# Patient Record
Sex: Female | Born: 1975 | Hispanic: No | Smoking: Never smoker
Health system: Southern US, Community
[De-identification: ages and names within clinical notes are randomized; demographics above are authoritative.]

## PROBLEM LIST (undated history)

## (undated) HISTORY — PX: PLACEMENT OF BREAST IMPLANTS: SHX6334

## (undated) HISTORY — PX: CHOLECYSTECTOMY: SHX55

---

## 1998-12-30 ENCOUNTER — Other Ambulatory Visit: Admission: RE | Admit: 1998-12-30 | Discharge: 1998-12-30 | Payer: Self-pay | Admitting: Obstetrics and Gynecology

## 2001-03-16 ENCOUNTER — Other Ambulatory Visit: Admission: RE | Admit: 2001-03-16 | Discharge: 2001-03-16 | Payer: Self-pay | Admitting: Obstetrics and Gynecology

## 2002-03-21 ENCOUNTER — Other Ambulatory Visit: Admission: RE | Admit: 2002-03-21 | Discharge: 2002-03-21 | Payer: Self-pay | Admitting: Obstetrics and Gynecology

## 2003-05-18 ENCOUNTER — Other Ambulatory Visit: Admission: RE | Admit: 2003-05-18 | Discharge: 2003-05-18 | Payer: Self-pay | Admitting: Obstetrics and Gynecology

## 2004-06-20 ENCOUNTER — Other Ambulatory Visit: Admission: RE | Admit: 2004-06-20 | Discharge: 2004-06-20 | Payer: Self-pay | Admitting: Obstetrics and Gynecology

## 2005-01-30 ENCOUNTER — Other Ambulatory Visit: Admission: RE | Admit: 2005-01-30 | Discharge: 2005-01-30 | Payer: Self-pay | Admitting: Obstetrics and Gynecology

## 2012-05-31 ENCOUNTER — Other Ambulatory Visit: Payer: Self-pay | Admitting: Obstetrics and Gynecology

## 2012-05-31 DIAGNOSIS — R928 Other abnormal and inconclusive findings on diagnostic imaging of breast: Secondary | ICD-10-CM

## 2012-06-07 ENCOUNTER — Other Ambulatory Visit: Payer: Self-pay | Admitting: Radiology

## 2012-06-13 ENCOUNTER — Other Ambulatory Visit: Payer: Self-pay

## 2016-03-04 DIAGNOSIS — J40 Bronchitis, not specified as acute or chronic: Secondary | ICD-10-CM | POA: Diagnosis not present

## 2016-05-20 DIAGNOSIS — Z6841 Body Mass Index (BMI) 40.0 and over, adult: Secondary | ICD-10-CM | POA: Diagnosis not present

## 2016-05-20 DIAGNOSIS — Z01419 Encounter for gynecological examination (general) (routine) without abnormal findings: Secondary | ICD-10-CM | POA: Diagnosis not present

## 2016-05-20 DIAGNOSIS — Z1231 Encounter for screening mammogram for malignant neoplasm of breast: Secondary | ICD-10-CM | POA: Diagnosis not present

## 2016-05-26 DIAGNOSIS — R635 Abnormal weight gain: Secondary | ICD-10-CM | POA: Diagnosis not present

## 2016-05-26 DIAGNOSIS — Z30433 Encounter for removal and reinsertion of intrauterine contraceptive device: Secondary | ICD-10-CM | POA: Diagnosis not present

## 2016-05-26 DIAGNOSIS — Z13220 Encounter for screening for lipoid disorders: Secondary | ICD-10-CM | POA: Diagnosis not present

## 2016-05-26 DIAGNOSIS — Z13228 Encounter for screening for other metabolic disorders: Secondary | ICD-10-CM | POA: Diagnosis not present

## 2016-05-26 DIAGNOSIS — R5383 Other fatigue: Secondary | ICD-10-CM | POA: Diagnosis not present

## 2016-09-24 DIAGNOSIS — R5383 Other fatigue: Secondary | ICD-10-CM | POA: Diagnosis not present

## 2016-11-23 DIAGNOSIS — L579 Skin changes due to chronic exposure to nonionizing radiation, unspecified: Secondary | ICD-10-CM | POA: Diagnosis not present

## 2016-11-23 DIAGNOSIS — D492 Neoplasm of unspecified behavior of bone, soft tissue, and skin: Secondary | ICD-10-CM | POA: Diagnosis not present

## 2016-11-23 DIAGNOSIS — L814 Other melanin hyperpigmentation: Secondary | ICD-10-CM | POA: Diagnosis not present

## 2016-11-23 DIAGNOSIS — D2339 Other benign neoplasm of skin of other parts of face: Secondary | ICD-10-CM | POA: Diagnosis not present

## 2016-11-23 DIAGNOSIS — L821 Other seborrheic keratosis: Secondary | ICD-10-CM | POA: Diagnosis not present

## 2017-02-09 DIAGNOSIS — F419 Anxiety disorder, unspecified: Secondary | ICD-10-CM | POA: Diagnosis not present

## 2017-02-16 DIAGNOSIS — J0100 Acute maxillary sinusitis, unspecified: Secondary | ICD-10-CM | POA: Diagnosis not present

## 2017-04-20 DIAGNOSIS — F419 Anxiety disorder, unspecified: Secondary | ICD-10-CM | POA: Diagnosis not present

## 2017-09-28 ENCOUNTER — Encounter: Payer: Self-pay | Admitting: Obstetrics & Gynecology

## 2017-12-20 DIAGNOSIS — Z01419 Encounter for gynecological examination (general) (routine) without abnormal findings: Secondary | ICD-10-CM | POA: Diagnosis not present

## 2017-12-20 DIAGNOSIS — Z1231 Encounter for screening mammogram for malignant neoplasm of breast: Secondary | ICD-10-CM | POA: Diagnosis not present

## 2017-12-20 DIAGNOSIS — Z6841 Body Mass Index (BMI) 40.0 and over, adult: Secondary | ICD-10-CM | POA: Diagnosis not present

## 2018-06-13 DIAGNOSIS — F419 Anxiety disorder, unspecified: Secondary | ICD-10-CM | POA: Diagnosis not present

## 2018-10-11 DIAGNOSIS — F419 Anxiety disorder, unspecified: Secondary | ICD-10-CM | POA: Diagnosis not present

## 2018-10-11 DIAGNOSIS — G4709 Other insomnia: Secondary | ICD-10-CM | POA: Diagnosis not present

## 2019-02-01 DIAGNOSIS — Z20828 Contact with and (suspected) exposure to other viral communicable diseases: Secondary | ICD-10-CM | POA: Diagnosis not present

## 2019-03-15 DIAGNOSIS — Z20828 Contact with and (suspected) exposure to other viral communicable diseases: Secondary | ICD-10-CM | POA: Diagnosis not present

## 2019-05-15 DIAGNOSIS — K5909 Other constipation: Secondary | ICD-10-CM | POA: Diagnosis not present

## 2019-06-21 DIAGNOSIS — Z6841 Body Mass Index (BMI) 40.0 and over, adult: Secondary | ICD-10-CM | POA: Diagnosis not present

## 2019-06-21 DIAGNOSIS — Z13228 Encounter for screening for other metabolic disorders: Secondary | ICD-10-CM | POA: Diagnosis not present

## 2019-06-21 DIAGNOSIS — Z1329 Encounter for screening for other suspected endocrine disorder: Secondary | ICD-10-CM | POA: Diagnosis not present

## 2019-06-21 DIAGNOSIS — Z803 Family history of malignant neoplasm of breast: Secondary | ICD-10-CM | POA: Diagnosis not present

## 2019-06-21 DIAGNOSIS — Z13220 Encounter for screening for lipoid disorders: Secondary | ICD-10-CM | POA: Diagnosis not present

## 2019-06-21 DIAGNOSIS — Z801 Family history of malignant neoplasm of trachea, bronchus and lung: Secondary | ICD-10-CM | POA: Diagnosis not present

## 2019-06-21 DIAGNOSIS — Z01419 Encounter for gynecological examination (general) (routine) without abnormal findings: Secondary | ICD-10-CM | POA: Diagnosis not present

## 2019-08-02 DIAGNOSIS — Z809 Family history of malignant neoplasm, unspecified: Secondary | ICD-10-CM | POA: Diagnosis not present

## 2019-08-02 DIAGNOSIS — Z9189 Other specified personal risk factors, not elsewhere classified: Secondary | ICD-10-CM | POA: Diagnosis not present

## 2019-08-04 DIAGNOSIS — Z1231 Encounter for screening mammogram for malignant neoplasm of breast: Secondary | ICD-10-CM | POA: Diagnosis not present

## 2020-03-06 ENCOUNTER — Other Ambulatory Visit: Payer: Self-pay | Admitting: Obstetrics and Gynecology

## 2020-03-06 DIAGNOSIS — Z9189 Other specified personal risk factors, not elsewhere classified: Secondary | ICD-10-CM

## 2020-04-10 ENCOUNTER — Ambulatory Visit
Admission: RE | Admit: 2020-04-10 | Discharge: 2020-04-10 | Disposition: A | Discharge status: Home / Self Care | Payer: BC Managed Care – PPO | Source: Ambulatory Visit | Attending: Obstetrics and Gynecology | Admitting: Obstetrics and Gynecology

## 2020-04-10 ENCOUNTER — Other Ambulatory Visit: Payer: Self-pay

## 2020-04-10 DIAGNOSIS — Z9189 Other specified personal risk factors, not elsewhere classified: Secondary | ICD-10-CM

## 2020-04-10 DIAGNOSIS — N6489 Other specified disorders of breast: Secondary | ICD-10-CM | POA: Diagnosis not present

## 2020-04-10 MED ORDER — GADOBUTROL 1 MMOL/ML IV SOLN
10.0000 mL | Freq: Once | INTRAVENOUS | Status: AC | PRN
  Administered 2020-04-10: 10 mL via INTRAVENOUS

## 2020-04-23 DIAGNOSIS — J0100 Acute maxillary sinusitis, unspecified: Secondary | ICD-10-CM | POA: Diagnosis not present

## 2020-10-10 DIAGNOSIS — H6592 Unspecified nonsuppurative otitis media, left ear: Secondary | ICD-10-CM | POA: Diagnosis not present

## 2020-10-10 DIAGNOSIS — H60392 Other infective otitis externa, left ear: Secondary | ICD-10-CM | POA: Diagnosis not present

## 2020-10-10 DIAGNOSIS — Z6841 Body Mass Index (BMI) 40.0 and over, adult: Secondary | ICD-10-CM | POA: Diagnosis not present

## 2020-10-14 DIAGNOSIS — D485 Neoplasm of uncertain behavior of skin: Secondary | ICD-10-CM | POA: Diagnosis not present

## 2020-10-14 DIAGNOSIS — L728 Other follicular cysts of the skin and subcutaneous tissue: Secondary | ICD-10-CM | POA: Diagnosis not present

## 2021-02-03 DIAGNOSIS — R6882 Decreased libido: Secondary | ICD-10-CM | POA: Diagnosis not present

## 2021-03-27 IMAGING — MR MR BREAST BILAT WO/W CM
7 of 9 series · 33 of 48 positions shown · IV contrast (10 ml gadavist)
Comparison: Bilateral screening mammogram dated 08/04/2019

CLINICAL DATA: High risk for developing breast cancer.

LABS:  None obtained on site today.
EXAM:
BILATERAL BREAST MRI WITH AND WITHOUT CONTRAST
TECHNIQUE: Multiplanar, multisequence MR images of both breasts were obtained
prior to and following the intravenous administration of 10 ml of
Gadavist

[Series 4: t2_tirm_tra ipat (a-p) · axial · 3.0mm · 0.82mm/px · 1 of 62 slices shown]
[im 1/62]
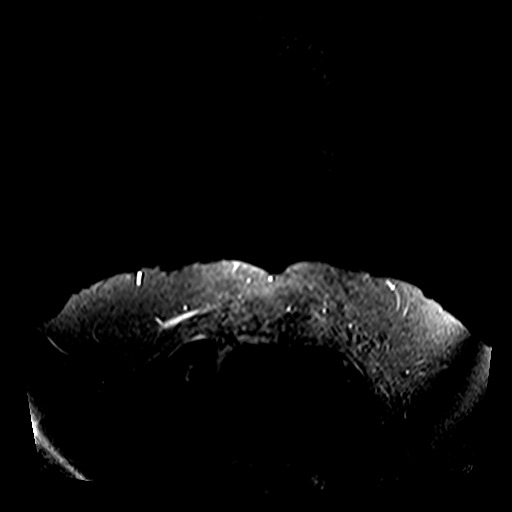

[Series 5: fl3d pre-cm no · axial · non-contrast · 1.2mm · 0.99mm/px · z∈[-130,+80]mm · 5 of 176 slices shown]
[im 1/176]
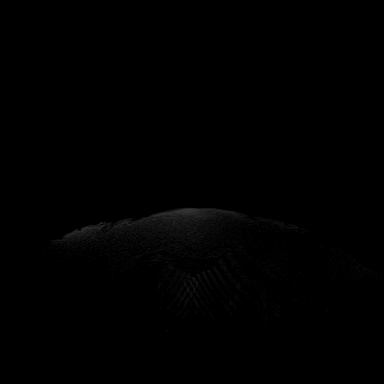
[im 44/176]
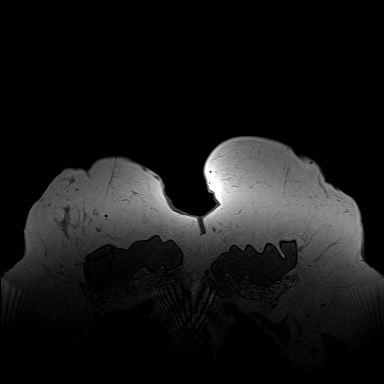
[im 88/176]
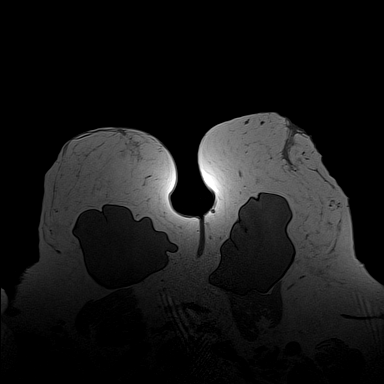
[im 132/176]
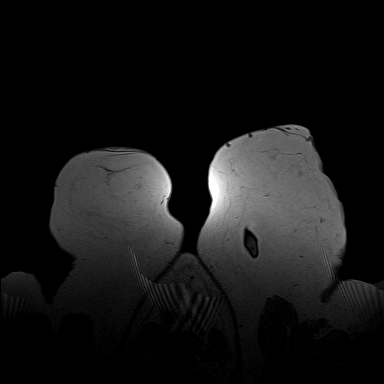
[im 176/176]
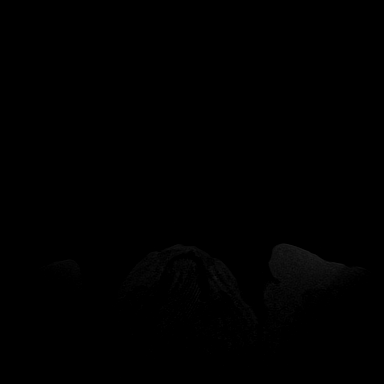

[Series 6: fl3d pre-cm · axial · non-contrast · 1.2mm · 0.99mm/px · z∈[-130,+80]mm · 6 of 176 slices shown]
[im 1/176]
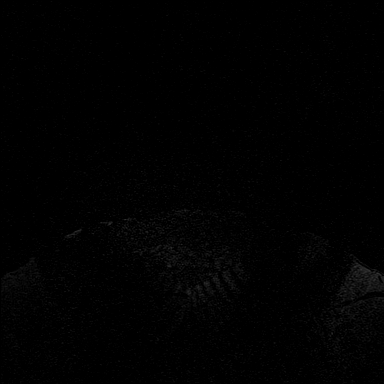
[im 36/176]
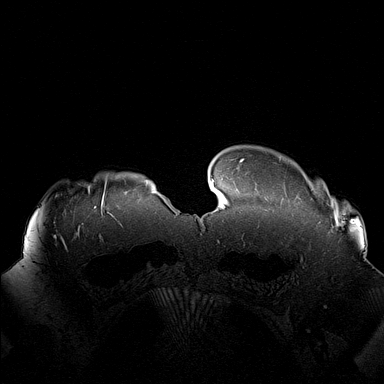
[im 71/176]
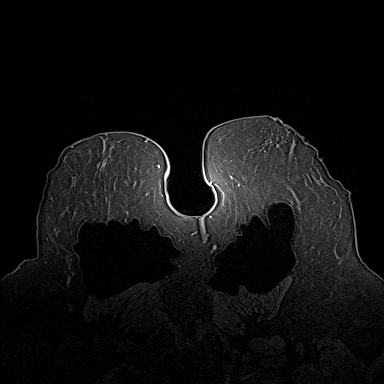
[im 106/176]
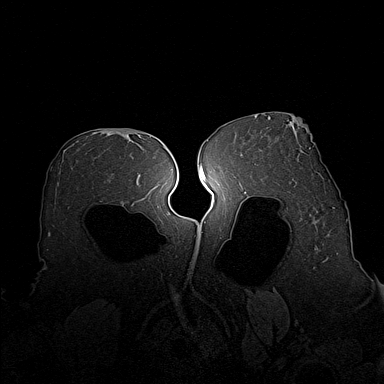
[im 141/176]
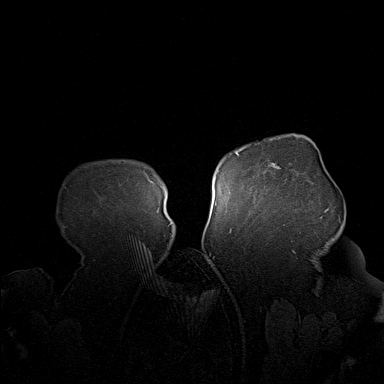
[im 176/176]
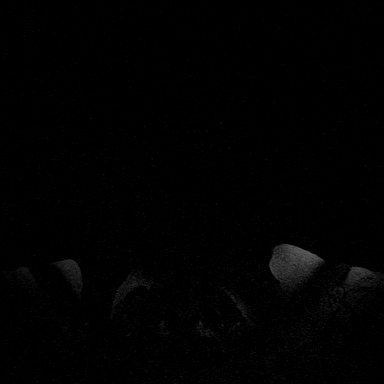

[Series 7: fl3d post-cm 20 · axial · 1.2mm · 0.99mm/px · z∈[-130,+80]mm · 6 of 176 slices shown (1 of 2)]
[im 1/176]
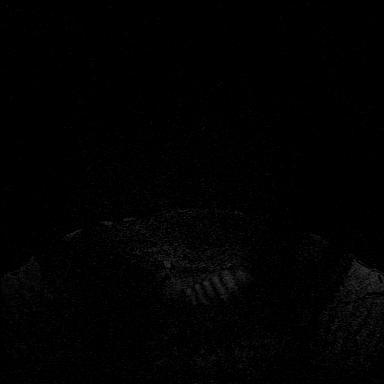
[im 36/176]
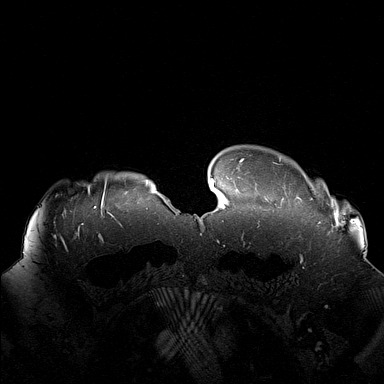
[im 71/176]
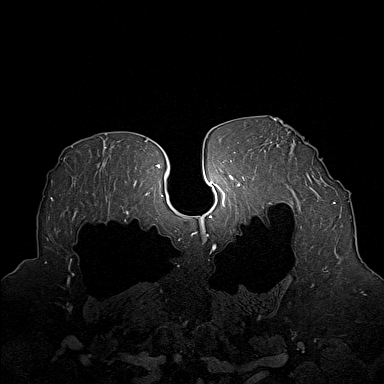
[im 106/176]
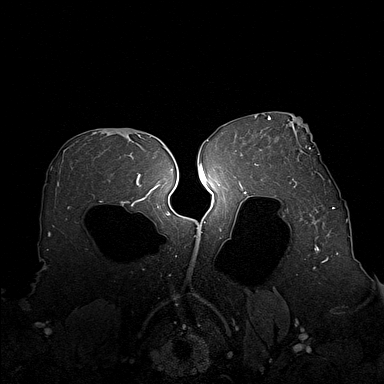
[im 141/176]
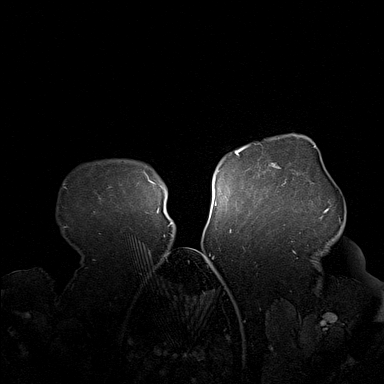
[im 176/176]
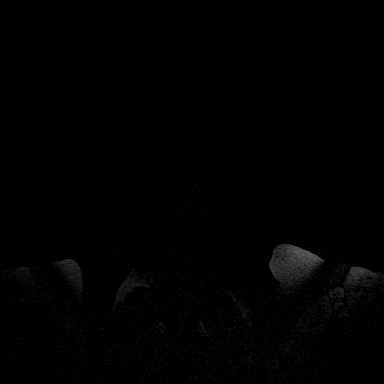

[Series 8: fl3d post-cm 20 · axial · 1.2mm · 0.99mm/px · z∈[-130,+80]mm · 6 of 176 slices shown (2 of 2)]
[im 1/176]
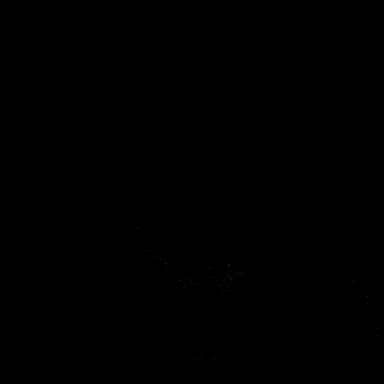
[im 36/176]
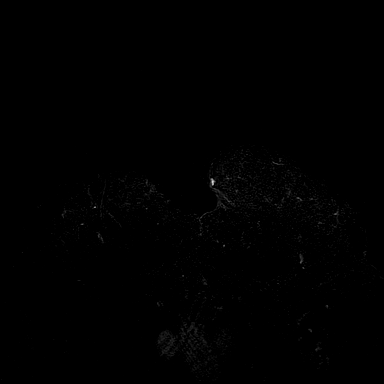
[im 71/176]
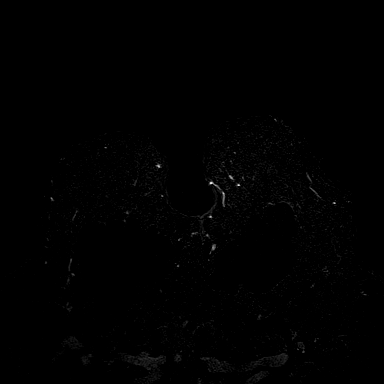
[im 106/176]
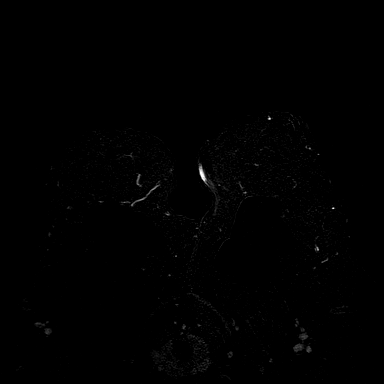
[im 141/176]
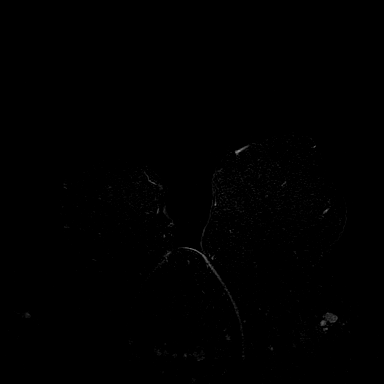
[im 176/176]
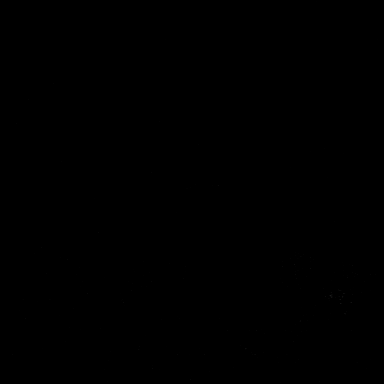

[Series 10: fl3d post-cm 3min · axial · 1.2mm · 0.99mm/px · z∈[-130,+80]mm · 6 of 176 slices shown]
[im 1/176]
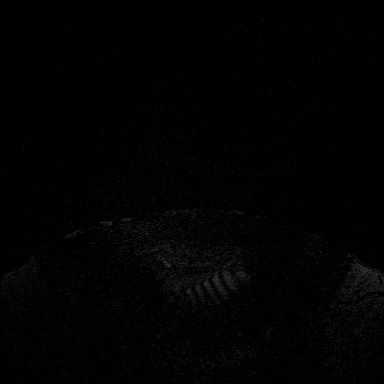
[im 36/176]
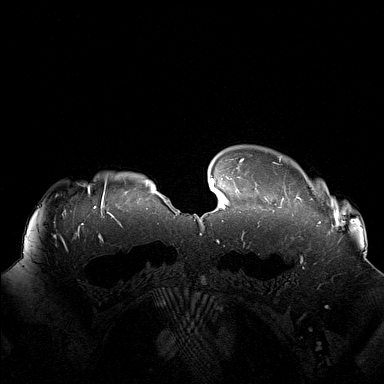
[im 71/176]
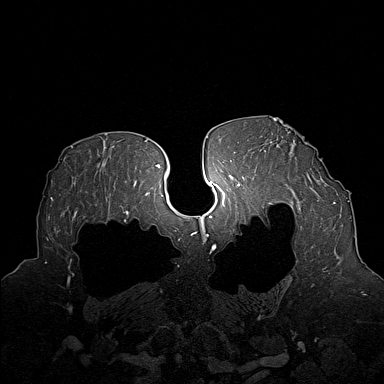
[im 106/176]
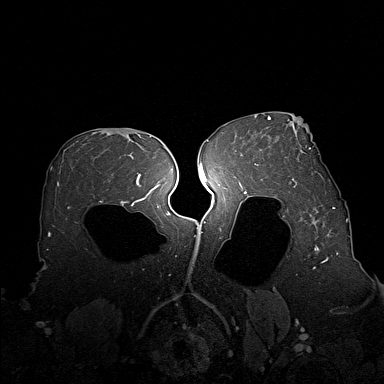
[im 141/176]
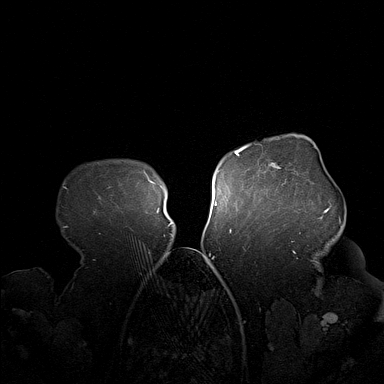
[im 176/176]
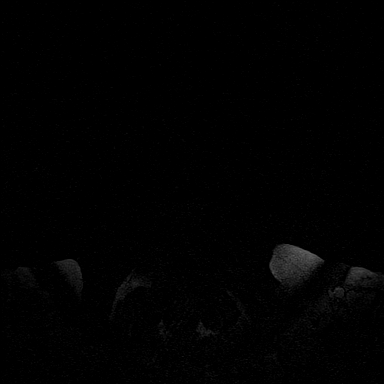

[Series 11: fl3d post-cm 3min_sub · axial · 1.2mm · 0.99mm/px · z∈[-130,-46]mm · 3 of 176 slices shown]
[im 1/176]
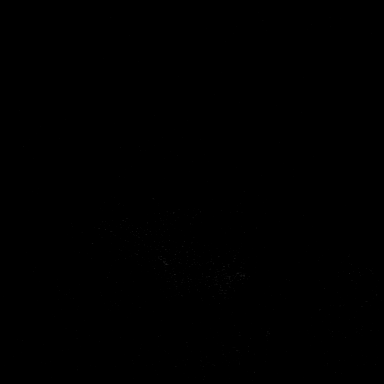
[im 36/176]
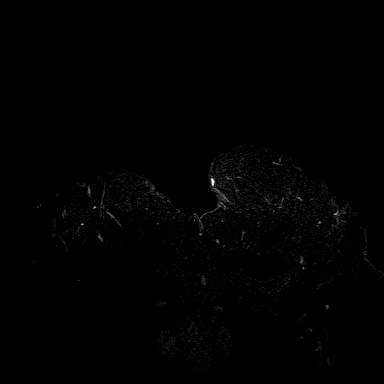
[im 71/176]
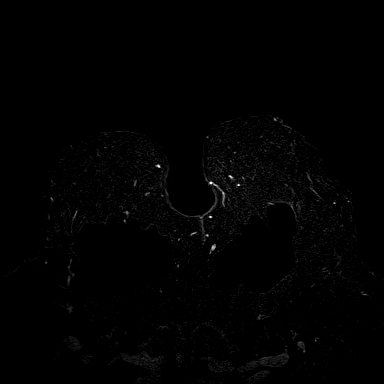

[33 of 48 positions shown; findings below may reference images not displayed]

Three-dimensional MR images were rendered by post-processing of the
original MR data on an independent workstation. The
three-dimensional MR images were interpreted, and findings are
reported in the following complete MRI report for this study. Three
dimensional images were evaluated at the independent interpreting
workstation using the DynaCAD thin client.
FINDINGS: Breast composition: b. Scattered fibroglandular tissue.

Background parenchymal enhancement: Minimal.

Right breast: No mass or abnormal enhancement. Intact retroglandular
implant.

Left breast: No mass or abnormal enhancement. Intact retroglandular
implant.

Lymph nodes: No abnormal appearing lymph nodes.

Ancillary findings:  None.
IMPRESSION: No evidence of malignancy.

RECOMMENDATION:
1. Bilateral screening mammogram 4 months when due.
2. Per American Cancer Society guidelines, if the patient has a
calculated lifetime risk of developing breast cancer of greater than
20%, annual screening MRI of the breasts would also be recommended.

BI-RADS CATEGORY  1: Negative.

## 2021-04-02 DIAGNOSIS — J0100 Acute maxillary sinusitis, unspecified: Secondary | ICD-10-CM | POA: Diagnosis not present

## 2021-04-16 DIAGNOSIS — R1013 Epigastric pain: Secondary | ICD-10-CM | POA: Diagnosis not present

## 2021-04-16 DIAGNOSIS — R12 Heartburn: Secondary | ICD-10-CM | POA: Diagnosis not present

## 2021-04-24 ENCOUNTER — Encounter: Payer: Self-pay | Admitting: Internal Medicine

## 2021-05-13 ENCOUNTER — Encounter: Payer: Self-pay | Admitting: Internal Medicine

## 2021-05-13 ENCOUNTER — Ambulatory Visit: Payer: BC Managed Care – PPO | Admitting: Internal Medicine

## 2021-05-13 VITALS — BP 138/80 | HR 76 | Ht 66.0 in | Wt 286.0 lb

## 2021-05-13 DIAGNOSIS — R198 Other specified symptoms and signs involving the digestive system and abdomen: Secondary | ICD-10-CM

## 2021-05-13 DIAGNOSIS — Z1211 Encounter for screening for malignant neoplasm of colon: Secondary | ICD-10-CM

## 2021-05-13 DIAGNOSIS — R0989 Other specified symptoms and signs involving the circulatory and respiratory systems: Secondary | ICD-10-CM | POA: Diagnosis not present

## 2021-05-13 DIAGNOSIS — R1013 Epigastric pain: Secondary | ICD-10-CM

## 2021-05-13 NOTE — Patient Instructions (Signed)
If you are age 46 or older, your body mass index should be between 23-30. Your Body mass index is 46.16 kg/m?Marland Kitchen If this is out of the aforementioned range listed, please consider follow up with your Primary Care Provider. ? ?If you are age 24 or younger, your body mass index should be between 19-25. Your Body mass index is 46.16 kg/m?Marland Kitchen If this is out of the aformentioned range listed, please consider follow up with your Primary Care Provider.  ? ?You have been scheduled for an endoscopy and colonoscopy. Please follow the written instructions given to you at your visit today. ?Please pick up your prep supplies at the pharmacy within the next 1-3 days. ?If you use inhalers (even only as needed), please bring them with you on the day of your procedure.  ? ?Start daily fiber Supplement like Metamucil or Benefiber. ? ?The [place] [facility] providers would like to encourage you to use [facility] to communicate with providers for non-urgent requests or questions.  Due to long hold times on the telephone, sending your provider a message by [facility] may be a faster and more efficient way to get a response.  Please allow 48 business hours for a response.  Please remember that this is for non-urgent requests.  ? ?It was a pleasure to see you today! ? ?Thank you for trusting me with your gastrointestinal care!   ? ?Christia Reading, MD  ? ?

## 2021-05-13 NOTE — Progress Notes (Signed)
? ?Chief Complaint: Epigastric ab pain ? ?HPI : 46 year old female with history of morbid obesity, anxiety, chronic sinusitis presents with epigastric ab pain ? ?She has been noticing a sensation of something stuck in her epigastric area. Has some abdominal pain in that area. She will sometimes feel this pain in her back. She will notice the pain before she has a BM and after she eats. She has been experiencing it for the last few months. She will experience it a few times per week. She has a very high stress level that has worsened over the last couple of months. She works in [employer] in [place] and recently had a promotion that has required a considerable amount of time commitment. She has lost weight but then gained most of it back. She has anxiety issues. She has alternating constipation and diarrhea. She does not feel like she fully clears herself out when she has a BM. Denies N&V. Has some occasional globus sensation. Denies dysphagia. Denies blood in stools. Denies chest burning or regurgitation. Denies fam hx of GI cancers. She did recently buy a Peloton and intends to use that to help with increasing her physical activity and weight loss. She uses Tylenol on occasion. Denies substantial NSAID use. She drinks 2 glasses of wine per day. ? ?Wt Readings from Last 3 Encounters:  ?05/13/21 286 lb (129.7 kg)  ? ?History reviewed. No pertinent past medical history. ? ?Past Surgical History:  ?Procedure Laterality Date  ? CHOLECYSTECTOMY    ? PLACEMENT OF BREAST IMPLANTS    ? ?Family History  ?Problem Relation Age of Onset  ? Colon cancer Neg Hx   ? Colon polyps Neg Hx   ? Pancreatic cancer Neg Hx   ? Liver cancer Neg Hx   ? Stomach cancer Neg Hx   ? ?Social History  ? ?Tobacco Use  ? Smoking status: Never  ? Smokeless tobacco: Never  ?Substance Use Topics  ? Alcohol use: Yes  ? Drug use: Never  ? ?Current Outpatient Medications  ?Medication Sig Dispense Refill  ? buPROPion (WELLBUTRIN SR) 150 MG 12 hr tablet 150 mg  daily.    ? omeprazole (PRILOSEC) 20 MG capsule Take 20 mg by mouth every morning.    ? ?No current facility-administered medications for this visit.  ? ?Not on File ? ?Review of Systems: ?All systems reviewed and negative except where noted in HPI.  ? ?Physical Exam: ?BP 138/80   Pulse 76   Ht '5\' 6"'$  (1.676 m)   Wt 286 lb (129.7 kg)   SpO2 98%   BMI 46.16 kg/m?  ?Constitutional: Pleasant,well-developed, female in no acute distress. ?HEENT: Normocephalic and atraumatic. Conjunctivae are normal. No scleral icterus. ?Cardiovascular: Normal rate, regular rhythm.  ?Pulmonary/chest: Effort normal and breath sounds normal. No wheezing, rales or rhonchi. ?Abdominal: Soft, nondistended, nontender. Bowel sounds active throughout. There are no masses palpable. No hepatomegaly. ?Extremities: No edema ?Neurological: Alert and oriented to person place and time. ?Skin: Skin is warm and dry. No rashes noted. ?Psychiatric: Normal mood and affect. Behavior is normal. ? ?Labs 03/2021: LFTs with mildly elevated indirect bilirubin of 0.8 (H) ? ?ASSESSMENT AND PLAN: ?Epigastric ab pain ?Alternating constipation and diarrhea ?Globus ?Colon cancer screening ?Patient presents with epigastric abdominal pain that has been present over the last few months.  Unclear etiology at this time but could consider contribution from GERD, PUD, H. pylori infection, IBS, or gastritis/duodenitis.  We will plan for EGD for further evaluation of an etiology of  her epigastric abdominal pain.  Since patient has issues with irregular bowel habits, will start her on a daily fiber supplement. Patient is also due for colon cancer screening so will plan for colonoscopy at the same time as her EGD. I went over the risks and benefits of the procedures with the patient, and she is agreeable to proceeding.  ?- Start fiber supplement ?- Encourage increased physical activity to help with bowel habits ?- EGD/colonoscopy [facility] ? ?Christia Reading, MD ? ?

## 2021-06-17 ENCOUNTER — Encounter: Payer: Self-pay | Admitting: Internal Medicine

## 2021-06-20 ENCOUNTER — Encounter: Payer: BC Managed Care – PPO | Admitting: Internal Medicine

## 2021-06-30 ENCOUNTER — Ambulatory Visit (AMBULATORY_SURGERY_CENTER): Payer: BC Managed Care – PPO | Admitting: Internal Medicine

## 2021-06-30 ENCOUNTER — Encounter: Payer: Self-pay | Admitting: Internal Medicine

## 2021-06-30 VITALS — BP 139/77 | HR 57 | Temp 98.6°F | Resp 12 | Ht 66.0 in | Wt 286.0 lb

## 2021-06-30 DIAGNOSIS — K2970 Gastritis, unspecified, without bleeding: Secondary | ICD-10-CM | POA: Diagnosis not present

## 2021-06-30 DIAGNOSIS — K2980 Duodenitis without bleeding: Secondary | ICD-10-CM | POA: Diagnosis not present

## 2021-06-30 DIAGNOSIS — Z1211 Encounter for screening for malignant neoplasm of colon: Secondary | ICD-10-CM | POA: Diagnosis not present

## 2021-06-30 DIAGNOSIS — K2950 Unspecified chronic gastritis without bleeding: Secondary | ICD-10-CM | POA: Diagnosis not present

## 2021-06-30 DIAGNOSIS — D128 Benign neoplasm of rectum: Secondary | ICD-10-CM

## 2021-06-30 DIAGNOSIS — K209 Esophagitis, unspecified without bleeding: Secondary | ICD-10-CM | POA: Diagnosis not present

## 2021-06-30 DIAGNOSIS — K317 Polyp of stomach and duodenum: Secondary | ICD-10-CM

## 2021-06-30 DIAGNOSIS — R198 Other specified symptoms and signs involving the digestive system and abdomen: Secondary | ICD-10-CM

## 2021-06-30 DIAGNOSIS — R1013 Epigastric pain: Secondary | ICD-10-CM

## 2021-06-30 MED ORDER — OMEPRAZOLE 40 MG PO CPDR: 40.0000 mg | DELAYED_RELEASE_CAPSULE | Freq: Two times a day (BID) | ORAL | 0 refills | Status: DC

## 2021-06-30 MED ORDER — SODIUM CHLORIDE 0.9 % IV SOLN: 500.0000 mL | Freq: Once | INTRAVENOUS | Status: DC

## 2021-06-30 MED ORDER — OMEPRAZOLE 40 MG PO CPDR: 40.0000 mg | DELAYED_RELEASE_CAPSULE | Freq: Every day | ORAL | 3 refills | Status: DC

## 2021-06-30 NOTE — Progress Notes (Signed)
   GASTROENTEROLOGY PROCEDURE H&P NOTE   Primary Care Physician: Pcp, No    Reason for Procedure:   Epigastric ab pain, globus, colon cancer screening  Plan:    EGD/colonoscopy  Patient is appropriate for endoscopic procedure(s) in the ambulatory ([place]) setting.  The nature of the procedure, as well as the risks, benefits, and alternatives were carefully and thoroughly reviewed with the patient. Ample time for discussion and questions allowed. The patient understood, was satisfied, and agreed to proceed.     HPI: Danielle Wilson is a 46 y.o. female who presents for EGD/colonoscopy for evaluation of epigastric ab pain, globus, colon cancer screening .  Patient was most recently seen in the Gastroenterology Clinic on 05/13/21.  No interval change in medical history since that appointment. Please refer to that note for full details regarding GI history and clinical presentation.   No past medical history on file.  Past Surgical History:  Procedure Laterality Date   CHOLECYSTECTOMY     PLACEMENT OF BREAST IMPLANTS      Prior to Admission medications   Medication Sig Start Date End Date Taking? Authorizing Provider  buPROPion (WELLBUTRIN SR) 150 MG 12 hr tablet 150 mg daily.    [provider]  omeprazole (PRILOSEC) 20 MG capsule Take 20 mg by mouth every morning. 04/16/21   [provider]    Current Outpatient Medications  Medication Sig Dispense Refill   buPROPion (WELLBUTRIN SR) 150 MG 12 hr tablet 150 mg daily.     omeprazole (PRILOSEC) 20 MG capsule Take 20 mg by mouth every morning.     No current facility-administered medications for this visit.    Allergies as of 06/30/2021 - Review Complete 06/30/2021  Allergen Reaction Noted   Penicillins Other (See Comments) 06/30/2021    Family History  Problem Relation Age of Onset   Colon cancer Neg Hx    Colon polyps Neg Hx    Pancreatic cancer Neg Hx    Liver cancer Neg Hx    Stomach cancer Neg Hx      Social History   Socioeconomic History   Marital status: Unknown    Spouse name: Not on file   Number of children: Not on file   Years of education: Not on file   Highest education level: Not on file  Occupational History   Not on file  Tobacco Use   Smoking status: Never   Smokeless tobacco: Never  Substance and Sexual Activity   Alcohol use: Yes   Drug use: Never   Sexual activity: Not on file  Other Topics Concern   Not on file  Social History Narrative   Not on file   Social Determinants of Health   Financial Resource Strain: Not on file  Food Insecurity: Not on file  Transportation Needs: Not on file  Physical Activity: Not on file  Stress: Not on file  Social Connections: Not on file  Intimate Partner Violence: Not on file    Physical Exam: Vital signs in last 24 hours: BP 130/79   Pulse 66   Temp 98.6 F (37 C) (Temporal)   Ht '5\' 6"'$  (1.676 m)   Wt 286 lb (129.7 kg)   SpO2 99%   BMI 46.16 kg/m  GEN: NAD EYE: Sclerae anicteric ENT: MMM CV: Non-tachycardic Pulm: No increased WOB GI: Soft NEURO:  Alert & Oriented   Christia Reading, MD [facility]   06/30/2021 1:08 PM

## 2021-06-30 NOTE — Op Note (Signed)
[facility] Patient Name: Danielle Wilson Procedure Date: 06/30/2021 1:34 PM MRN: [id number] Endoscopist: Sonny Masters "Christia Reading ,  Age: 46 Referring MD:  Date of Birth: [birth year] Gender: Female Account #: [id number] Procedure:                Colonoscopy Indications:              Screening for colorectal malignant neoplasm Medicines:                Monitored Anesthesia Care Procedure:                Pre-Anesthesia Assessment:                           - Prior to the procedure, a History and Physical                            was performed, and patient medications and                            allergies were reviewed. The patient's tolerance of                            previous anesthesia was also reviewed. The risks                            and benefits of the procedure and the sedation                            options and risks were discussed with the patient.                            All questions were answered, and informed consent                            was obtained. Prior Anticoagulants: The patient has                            taken no previous anticoagulant or antiplatelet                            agents. ASA Grade Assessment: II - A patient with                            mild systemic disease. After reviewing the risks                            and benefits, the patient was deemed in                            satisfactory condition to undergo the procedure.                           After obtaining informed consent, the colonoscope  was passed under direct vision. Throughout the                            procedure, the patient's blood pressure, pulse, and                            oxygen saturations were monitored continuously. The                            CF [id number] #[id number] was introduced through the anus                            and advanced to the the terminal ileum. The                            colonoscopy was  performed without difficulty. The                            patient tolerated the procedure well. The quality                            of the bowel preparation was excellent. The                            terminal ileum, ileocecal valve, appendiceal                            orifice, and rectum were photographed. Scope In: 1:48:11 PM Scope Out: 2:03:50 PM Scope Withdrawal Time: 0 hours 12 minutes 17 seconds  Total Procedure Duration: 0 hours 15 minutes 39 seconds  Findings:                 The terminal ileum appeared normal.                           A 3 mm polyp was found in the rectum. The polyp was                            sessile. The polyp was removed with a cold snare.                            Resection and retrieval were complete.                           Non-bleeding internal hemorrhoids were found during                            retroflexion. Complications:            No immediate complications. Estimated Blood Loss:     Estimated blood loss was minimal. Impression:               - The examined portion of the ileum was normal.                           -  One 3 mm polyp in the rectum, removed with a cold                            snare. Resected and retrieved.                           - Non-bleeding internal hemorrhoids. Recommendation:           - Discharge patient to home (with escort).                           - Await pathology results.                           - Return to GI clinic in 6 weeks.                           - The findings and recommendations were discussed                            with the patient. [id number]Christia Reading,  06/30/2021 2:09:48 PM

## 2021-06-30 NOTE — Progress Notes (Signed)
Pt non-responsive, VVS, Report to RN  °

## 2021-06-30 NOTE — Progress Notes (Signed)
Pt's states no medical or surgical changes since previsit or office visit. 

## 2021-06-30 NOTE — Patient Instructions (Addendum)
Handouts given for polyps and gastritis.  Use Prilosec 40 mg twice a day for 8 weeks. Return to GI clinic in 6 weeks.  Await pathology report.  YOU HAD AN ENDOSCOPIC PROCEDURE TODAY AT [facility]:   Refer to the procedure report that was given to you for any specific questions about what was found during the examination.  If the procedure report does not answer your questions, please call your gastroenterologist to clarify.  If you requested that your care partner not be given the details of your procedure findings, then the procedure report has been included in a sealed envelope for you to review at your convenience later.  YOU SHOULD EXPECT: Some feelings of bloating in the abdomen. Passage of more gas than usual.  Walking can help get rid of the air that was put into your GI tract during the procedure and reduce the bloating. If you had a lower endoscopy (such as a colonoscopy or flexible sigmoidoscopy) you may notice spotting of blood in your stool or on the toilet paper. If you underwent a bowel prep for your procedure, you may not have a normal bowel movement for a few days.  Please Note:  You might notice some irritation and congestion in your nose or some drainage.  This is from the oxygen used during your procedure.  There is no need for concern and it should clear up in a day or so.  SYMPTOMS TO REPORT IMMEDIATELY:  Following lower endoscopy (colonoscopy or flexible sigmoidoscopy):  Excessive amounts of blood in the stool  Significant tenderness or worsening of abdominal pains  Swelling of the abdomen that is new, acute  Fever of 100F or higher  Following upper endoscopy (EGD)  Vomiting of blood or coffee ground material  New chest pain or pain under the shoulder blades  Painful or persistently difficult swallowing  New shortness of breath  Fever of 100F or higher  Black, tarry-looking stools  For urgent or emergent issues, a gastroenterologist can be reached at  any hour by calling [phone]. Do not use MyChart messaging for urgent concerns.    DIET:  We do recommend a small meal at first, but then you may proceed to your regular diet.  Drink plenty of fluids but you should avoid alcoholic beverages for 24 hours.  ACTIVITY:  You should plan to take it easy for the rest of today and you should NOT DRIVE or use heavy machinery until tomorrow (because of the sedation medicines used during the test).    FOLLOW UP: Our staff will call the number listed on your records 24-72 hours following your procedure to check on you and address any questions or concerns that you may have regarding the information given to you following your procedure. If we do not reach you, we will leave a message.  We will attempt to reach you two times.  During this call, we will ask if you have developed any symptoms of COVID 19. If you develop any symptoms (ie: fever, flu-like symptoms, shortness of breath, cough etc.) before then, please call [phone].  If you test positive for Covid 19 in the 2 weeks post procedure, please call and report this information to [place].    If any biopsies were taken you will be contacted by phone or by letter within the next 1-3 weeks.  Please call us at [phone] if you have not heard about the biopsies in 3 weeks.    SIGNATURES/CONFIDENTIALITY: You and/or your  care partner have signed paperwork which will be entered into your electronic medical record.  These signatures attest to the fact that that the information above on your After Visit Summary has been reviewed and is understood.  Full responsibility of the confidentiality of this discharge information lies with you and/or your care-partner.

## 2021-06-30 NOTE — Op Note (Signed)
[facility] Patient Name: Danielle Wilson Procedure Date: 06/30/2021 1:35 PM MRN: [id number] Endoscopist: Sonny Masters "Danielle Wilson ,  Age: 46 Referring MD:  Date of Birth: [birth year] Gender: Female Account #: [id number] Procedure:                Upper GI endoscopy Indications:              Epigastric abdominal pain Medicines:                Monitored Anesthesia Care Procedure:                Pre-Anesthesia Assessment:                           - Prior to the procedure, a History and Physical                            was performed, and patient medications and                            allergies were reviewed. The patient's tolerance of                            previous anesthesia was also reviewed. The risks                            and benefits of the procedure and the sedation                            options and risks were discussed with the patient.                            All questions were answered, and informed consent                            was obtained. Prior Anticoagulants: The patient has                            taken no previous anticoagulant or antiplatelet                            agents. ASA Grade Assessment: II - A patient with                            mild systemic disease. After reviewing the risks                            and benefits, the patient was deemed in                            satisfactory condition to undergo the procedure.                           After obtaining informed consent, the endoscope was  passed under direct vision. Throughout the                            procedure, the patient's blood pressure, pulse, and                            oxygen saturations were monitored continuously. The                            GIF [id number] #[id number] was introduced through the                            mouth, and advanced to the second part of duodenum.                            The upper GI endoscopy was  accomplished without                            difficulty. The patient tolerated the procedure                            well. Scope In: Scope Out: Findings:                 Salmon-colored mucosa was present. The maximum                            longitudinal extent of these esophageal mucosal                            changes was 1 cm in length. Mucosa was biopsied                            with a cold forceps for histology.                           Two 2 to 3 mm sessile polyps with no bleeding and                            no stigmata of recent bleeding were found in the                            gastric fundus. These polyps were removed with a                            cold biopsy forceps. Resection and retrieval were                            complete.                           Localized mildly erythematous mucosa without                            bleeding was found in  the gastric antrum. Biopsies                            were taken with a cold forceps for histology.                           The examined duodenum was normal. Biopsies were                            taken with a cold forceps for histology. Complications:            No immediate complications. Estimated Blood Loss:     Estimated blood loss was minimal. Impression:               - Salmon-colored mucosa. Biopsied.                           - Two gastric polyps. Resected and retrieved.                           - Erythematous mucosa in the antrum. Biopsied.                           - Normal examined duodenum. Biopsied. Recommendation:           - Await pathology results.                           - Use Prilosec (omeprazole) 40 mg PO BID for 8                            weeks.                           - Perform a colonoscopy today. Sonny Masters "Danielle Wilson,  06/30/2021 2:07:37 PM

## 2021-07-01 ENCOUNTER — Telehealth: Payer: Self-pay

## 2021-07-01 NOTE — Telephone Encounter (Signed)
  Follow up Call-     06/30/2021    1:05 PM  Call back number  Post procedure Call Back phone  # [phone]  Permission to leave phone message Yes     Patient questions:  Do you have a fever, pain , or abdominal swelling? No. Pain Score  0 *  Have you tolerated food without any problems? Yes.    Have you been able to return to your normal activities? Yes.    Do you have any questions about your discharge instructions: Diet   No. Medications  No. Follow up visit  No.  Do you have questions or concerns about your Care? No.  Actions: * If pain score is 4 or above: No action needed, pain <4.

## 2021-07-03 ENCOUNTER — Encounter: Payer: Self-pay | Admitting: Internal Medicine

## 2021-07-31 DIAGNOSIS — N951 Menopausal and female climacteric states: Secondary | ICD-10-CM | POA: Diagnosis not present

## 2021-07-31 DIAGNOSIS — F418 Other specified anxiety disorders: Secondary | ICD-10-CM | POA: Diagnosis not present

## 2021-08-15 ENCOUNTER — Ambulatory Visit: Payer: BC Managed Care – PPO | Admitting: Internal Medicine

## 2021-08-18 ENCOUNTER — Encounter: Payer: Self-pay | Admitting: Internal Medicine

## 2021-08-20 DIAGNOSIS — Z124 Encounter for screening for malignant neoplasm of cervix: Secondary | ICD-10-CM | POA: Diagnosis not present

## 2021-08-20 DIAGNOSIS — Z6841 Body Mass Index (BMI) 40.0 and over, adult: Secondary | ICD-10-CM | POA: Diagnosis not present

## 2021-08-20 DIAGNOSIS — Z1151 Encounter for screening for human papillomavirus (HPV): Secondary | ICD-10-CM | POA: Diagnosis not present

## 2021-08-20 DIAGNOSIS — Z01419 Encounter for gynecological examination (general) (routine) without abnormal findings: Secondary | ICD-10-CM | POA: Diagnosis not present

## 2021-09-15 DIAGNOSIS — Z1331 Encounter for screening for depression: Secondary | ICD-10-CM | POA: Diagnosis not present

## 2021-09-15 DIAGNOSIS — Z131 Encounter for screening for diabetes mellitus: Secondary | ICD-10-CM | POA: Diagnosis not present

## 2021-09-15 DIAGNOSIS — R635 Abnormal weight gain: Secondary | ICD-10-CM | POA: Diagnosis not present

## 2021-09-15 DIAGNOSIS — R5383 Other fatigue: Secondary | ICD-10-CM | POA: Diagnosis not present

## 2021-09-15 DIAGNOSIS — E8889 Other specified metabolic disorders: Secondary | ICD-10-CM | POA: Diagnosis not present

## 2021-09-17 DIAGNOSIS — Z1231 Encounter for screening mammogram for malignant neoplasm of breast: Secondary | ICD-10-CM | POA: Diagnosis not present

## 2021-09-30 DIAGNOSIS — R635 Abnormal weight gain: Secondary | ICD-10-CM | POA: Diagnosis not present

## 2021-09-30 DIAGNOSIS — R946 Abnormal results of thyroid function studies: Secondary | ICD-10-CM | POA: Diagnosis not present

## 2021-09-30 DIAGNOSIS — E559 Vitamin D deficiency, unspecified: Secondary | ICD-10-CM | POA: Diagnosis not present

## 2021-10-30 DIAGNOSIS — E063 Autoimmune thyroiditis: Secondary | ICD-10-CM | POA: Diagnosis not present

## 2021-10-30 DIAGNOSIS — Z87898 Personal history of other specified conditions: Secondary | ICD-10-CM | POA: Diagnosis not present

## 2021-10-30 DIAGNOSIS — E039 Hypothyroidism, unspecified: Secondary | ICD-10-CM | POA: Diagnosis not present

## 2021-10-30 DIAGNOSIS — R5383 Other fatigue: Secondary | ICD-10-CM | POA: Diagnosis not present

## 2021-11-06 DIAGNOSIS — E559 Vitamin D deficiency, unspecified: Secondary | ICD-10-CM | POA: Diagnosis not present

## 2021-11-06 DIAGNOSIS — K581 Irritable bowel syndrome with constipation: Secondary | ICD-10-CM | POA: Diagnosis not present

## 2021-11-06 DIAGNOSIS — E038 Other specified hypothyroidism: Secondary | ICD-10-CM | POA: Diagnosis not present

## 2021-12-16 DIAGNOSIS — E559 Vitamin D deficiency, unspecified: Secondary | ICD-10-CM | POA: Diagnosis not present

## 2021-12-16 DIAGNOSIS — E038 Other specified hypothyroidism: Secondary | ICD-10-CM | POA: Diagnosis not present

## 2021-12-16 DIAGNOSIS — K581 Irritable bowel syndrome with constipation: Secondary | ICD-10-CM | POA: Diagnosis not present

## 2022-01-27 DIAGNOSIS — E038 Other specified hypothyroidism: Secondary | ICD-10-CM | POA: Diagnosis not present

## 2022-01-27 DIAGNOSIS — E559 Vitamin D deficiency, unspecified: Secondary | ICD-10-CM | POA: Diagnosis not present

## 2022-01-27 DIAGNOSIS — K581 Irritable bowel syndrome with constipation: Secondary | ICD-10-CM | POA: Diagnosis not present

## 2022-02-16 DIAGNOSIS — R059 Cough, unspecified: Secondary | ICD-10-CM | POA: Diagnosis not present

## 2022-02-16 DIAGNOSIS — R051 Acute cough: Secondary | ICD-10-CM | POA: Diagnosis not present

## 2022-02-16 DIAGNOSIS — Z20822 Contact with and (suspected) exposure to covid-19: Secondary | ICD-10-CM | POA: Diagnosis not present

## 2022-02-16 DIAGNOSIS — J029 Acute pharyngitis, unspecified: Secondary | ICD-10-CM | POA: Diagnosis not present

## 2022-02-16 DIAGNOSIS — R52 Pain, unspecified: Secondary | ICD-10-CM | POA: Diagnosis not present

## 2022-02-24 DIAGNOSIS — E038 Other specified hypothyroidism: Secondary | ICD-10-CM | POA: Diagnosis not present

## 2022-02-24 DIAGNOSIS — K581 Irritable bowel syndrome with constipation: Secondary | ICD-10-CM | POA: Diagnosis not present

## 2022-02-24 DIAGNOSIS — E559 Vitamin D deficiency, unspecified: Secondary | ICD-10-CM | POA: Diagnosis not present

## 2022-04-13 DIAGNOSIS — K581 Irritable bowel syndrome with constipation: Secondary | ICD-10-CM | POA: Diagnosis not present

## 2022-04-13 DIAGNOSIS — E559 Vitamin D deficiency, unspecified: Secondary | ICD-10-CM | POA: Diagnosis not present

## 2022-04-21 ENCOUNTER — Other Ambulatory Visit: Payer: Self-pay | Admitting: Internal Medicine

## 2022-04-21 DIAGNOSIS — R1013 Epigastric pain: Secondary | ICD-10-CM

## 2022-05-25 ENCOUNTER — Other Ambulatory Visit (HOSPITAL_BASED_OUTPATIENT_CLINIC_OR_DEPARTMENT_OTHER): Payer: Self-pay

## 2022-05-25 ENCOUNTER — Encounter (HOSPITAL_BASED_OUTPATIENT_CLINIC_OR_DEPARTMENT_OTHER): Payer: Self-pay | Admitting: Pharmacist

## 2022-05-25 DIAGNOSIS — E039 Hypothyroidism, unspecified: Secondary | ICD-10-CM | POA: Diagnosis not present

## 2022-05-25 DIAGNOSIS — E559 Vitamin D deficiency, unspecified: Secondary | ICD-10-CM | POA: Diagnosis not present

## 2022-05-25 DIAGNOSIS — K581 Irritable bowel syndrome with constipation: Secondary | ICD-10-CM | POA: Diagnosis not present

## 2022-05-25 MED ORDER — ZEPBOUND 7.5 MG/0.5ML ~~LOC~~ SOAJ
7.5000 mg | SUBCUTANEOUS | 1 refills | Status: AC
  Filled 2022-05-25: qty 2, 28d supply, fill #0

## 2022-05-26 ENCOUNTER — Other Ambulatory Visit (HOSPITAL_BASED_OUTPATIENT_CLINIC_OR_DEPARTMENT_OTHER): Payer: Self-pay

## 2022-05-28 ENCOUNTER — Other Ambulatory Visit (HOSPITAL_BASED_OUTPATIENT_CLINIC_OR_DEPARTMENT_OTHER): Payer: Self-pay

## 2022-05-28 MED ORDER — ZEPBOUND 10 MG/0.5ML ~~LOC~~ SOAJ
10.0000 mg | SUBCUTANEOUS | 0 refills | Status: DC
Start: 2022-05-28 — End: 2022-06-17
  Filled 2022-05-28: qty 2, 28d supply, fill #0

## 2022-06-03 ENCOUNTER — Other Ambulatory Visit (HOSPITAL_BASED_OUTPATIENT_CLINIC_OR_DEPARTMENT_OTHER): Payer: Self-pay

## 2022-06-16 ENCOUNTER — Other Ambulatory Visit (HOSPITAL_BASED_OUTPATIENT_CLINIC_OR_DEPARTMENT_OTHER): Payer: Self-pay

## 2022-06-17 ENCOUNTER — Other Ambulatory Visit (HOSPITAL_BASED_OUTPATIENT_CLINIC_OR_DEPARTMENT_OTHER): Payer: Self-pay

## 2022-06-17 MED ORDER — ZEPBOUND 10 MG/0.5ML ~~LOC~~ SOAJ
10.0000 mg | SUBCUTANEOUS | 0 refills | Status: DC
Start: 2022-06-17 — End: 2022-06-25
  Filled 2022-06-17: qty 2, 28d supply, fill #0

## 2022-06-19 ENCOUNTER — Other Ambulatory Visit (HOSPITAL_BASED_OUTPATIENT_CLINIC_OR_DEPARTMENT_OTHER): Payer: Self-pay

## 2022-06-25 ENCOUNTER — Other Ambulatory Visit (HOSPITAL_BASED_OUTPATIENT_CLINIC_OR_DEPARTMENT_OTHER): Payer: Self-pay

## 2022-06-25 DIAGNOSIS — K581 Irritable bowel syndrome with constipation: Secondary | ICD-10-CM | POA: Diagnosis not present

## 2022-06-25 DIAGNOSIS — E559 Vitamin D deficiency, unspecified: Secondary | ICD-10-CM | POA: Diagnosis not present

## 2022-06-25 DIAGNOSIS — E039 Hypothyroidism, unspecified: Secondary | ICD-10-CM | POA: Diagnosis not present

## 2022-06-25 MED ORDER — ZEPBOUND 10 MG/0.5ML ~~LOC~~ SOAJ
10.0000 mg | SUBCUTANEOUS | 0 refills | Status: AC
Start: 2022-06-25 — End: ?
  Filled 2022-06-25 – 2022-07-21 (×2): qty 2, 28d supply, fill #0

## 2022-07-21 ENCOUNTER — Other Ambulatory Visit (HOSPITAL_BASED_OUTPATIENT_CLINIC_OR_DEPARTMENT_OTHER): Payer: Self-pay

## 2022-07-21 ENCOUNTER — Other Ambulatory Visit: Payer: Self-pay | Admitting: Internal Medicine

## 2022-07-21 DIAGNOSIS — R1013 Epigastric pain: Secondary | ICD-10-CM

## 2022-07-24 ENCOUNTER — Other Ambulatory Visit: Payer: Self-pay

## 2022-07-27 ENCOUNTER — Other Ambulatory Visit (HOSPITAL_BASED_OUTPATIENT_CLINIC_OR_DEPARTMENT_OTHER): Payer: Self-pay

## 2022-07-27 MED ORDER — ZEPBOUND 12.5 MG/0.5ML ~~LOC~~ SOAJ
12.5000 mg | SUBCUTANEOUS | 0 refills | Status: DC
  Filled 2022-07-27: qty 2, 28d supply, fill #0

## 2022-07-28 DIAGNOSIS — K581 Irritable bowel syndrome with constipation: Secondary | ICD-10-CM | POA: Diagnosis not present

## 2022-07-28 DIAGNOSIS — E039 Hypothyroidism, unspecified: Secondary | ICD-10-CM | POA: Diagnosis not present

## 2022-07-28 DIAGNOSIS — E559 Vitamin D deficiency, unspecified: Secondary | ICD-10-CM | POA: Diagnosis not present

## 2022-07-29 ENCOUNTER — Other Ambulatory Visit (HOSPITAL_BASED_OUTPATIENT_CLINIC_OR_DEPARTMENT_OTHER): Payer: Self-pay

## 2022-07-29 MED ORDER — LEVOTHYROXINE SODIUM 25 MCG PO TABS
25.0000 ug | ORAL_TABLET | Freq: Every morning | ORAL | 0 refills | Status: AC
  Filled 2022-07-29: qty 30, 30d supply, fill #0

## 2022-07-29 MED ORDER — LEVOTHYROXINE SODIUM 25 MCG PO TABS
25.0000 ug | ORAL_TABLET | Freq: Every day | ORAL | 4 refills | Status: AC
  Filled 2022-07-29: qty 30, 30d supply, fill #0
  Filled 2022-08-26: qty 30, 30d supply, fill #1

## 2022-08-01 ENCOUNTER — Other Ambulatory Visit (HOSPITAL_BASED_OUTPATIENT_CLINIC_OR_DEPARTMENT_OTHER): Payer: Self-pay

## 2022-08-08 ENCOUNTER — Other Ambulatory Visit (HOSPITAL_BASED_OUTPATIENT_CLINIC_OR_DEPARTMENT_OTHER): Payer: Self-pay

## 2022-08-10 ENCOUNTER — Other Ambulatory Visit (HOSPITAL_BASED_OUTPATIENT_CLINIC_OR_DEPARTMENT_OTHER): Payer: Self-pay

## 2022-08-10 MED ORDER — ZEPBOUND 10 MG/0.5ML ~~LOC~~ SOAJ
10.0000 mg | SUBCUTANEOUS | 0 refills | Status: AC
  Filled 2022-08-10: qty 2, 28d supply, fill #0

## 2022-08-19 ENCOUNTER — Other Ambulatory Visit (HOSPITAL_BASED_OUTPATIENT_CLINIC_OR_DEPARTMENT_OTHER): Payer: Self-pay

## 2022-08-19 MED ORDER — ZEPBOUND 10 MG/0.5ML ~~LOC~~ SOAJ
10.0000 mg | SUBCUTANEOUS | 0 refills | Status: AC
  Filled 2022-08-19: qty 2, 28d supply, fill #0

## 2022-08-20 ENCOUNTER — Other Ambulatory Visit (HOSPITAL_BASED_OUTPATIENT_CLINIC_OR_DEPARTMENT_OTHER): Payer: Self-pay

## 2022-08-20 MED ORDER — ZEPBOUND 12.5 MG/0.5ML ~~LOC~~ SOAJ
12.5000 mg | SUBCUTANEOUS | 0 refills | Status: AC
  Filled 2022-08-20 – 2022-08-28 (×3): qty 2, 28d supply, fill #0

## 2022-08-21 ENCOUNTER — Other Ambulatory Visit (HOSPITAL_BASED_OUTPATIENT_CLINIC_OR_DEPARTMENT_OTHER): Payer: Self-pay

## 2022-08-22 ENCOUNTER — Other Ambulatory Visit (HOSPITAL_BASED_OUTPATIENT_CLINIC_OR_DEPARTMENT_OTHER): Payer: Self-pay

## 2022-08-26 ENCOUNTER — Other Ambulatory Visit (HOSPITAL_BASED_OUTPATIENT_CLINIC_OR_DEPARTMENT_OTHER): Payer: Self-pay

## 2022-08-27 ENCOUNTER — Other Ambulatory Visit (HOSPITAL_BASED_OUTPATIENT_CLINIC_OR_DEPARTMENT_OTHER): Payer: Self-pay

## 2022-08-28 ENCOUNTER — Other Ambulatory Visit (HOSPITAL_BASED_OUTPATIENT_CLINIC_OR_DEPARTMENT_OTHER): Payer: Self-pay

## 2022-09-02 ENCOUNTER — Other Ambulatory Visit (HOSPITAL_BASED_OUTPATIENT_CLINIC_OR_DEPARTMENT_OTHER): Payer: Self-pay

## 2022-09-03 ENCOUNTER — Other Ambulatory Visit (HOSPITAL_COMMUNITY): Payer: Self-pay

## 2022-09-03 ENCOUNTER — Other Ambulatory Visit: Payer: Self-pay

## 2022-09-03 ENCOUNTER — Other Ambulatory Visit (HOSPITAL_BASED_OUTPATIENT_CLINIC_OR_DEPARTMENT_OTHER): Payer: Self-pay

## 2022-09-03 DIAGNOSIS — E039 Hypothyroidism, unspecified: Secondary | ICD-10-CM | POA: Diagnosis not present

## 2022-09-03 DIAGNOSIS — E559 Vitamin D deficiency, unspecified: Secondary | ICD-10-CM | POA: Diagnosis not present

## 2022-09-03 DIAGNOSIS — K581 Irritable bowel syndrome with constipation: Secondary | ICD-10-CM | POA: Diagnosis not present

## 2022-09-03 MED ORDER — ZEPBOUND 12.5 MG/0.5ML ~~LOC~~ SOAJ
12.5000 mg | SUBCUTANEOUS | 1 refills | Status: AC
  Filled 2022-09-03: qty 2, 28d supply, fill #0

## 2022-09-03 MED ORDER — ZEPBOUND 15 MG/0.5ML ~~LOC~~ SOAJ
15.0000 mg | SUBCUTANEOUS | 1 refills | Status: DC
  Filled 2022-09-03: qty 2, 28d supply, fill #0
  Filled 2022-09-26: qty 2, 28d supply, fill #1

## 2022-09-03 MED ORDER — LEVOTHYROXINE SODIUM 25 MCG PO TABS
25.0000 ug | ORAL_TABLET | Freq: Every morning | ORAL | 4 refills | Status: AC
  Filled 2022-09-03 – 2022-09-29 (×2): qty 30, 30d supply, fill #0

## 2022-09-03 MED ORDER — OMEPRAZOLE 40 MG PO CPDR
40.0000 mg | DELAYED_RELEASE_CAPSULE | Freq: Every day | ORAL | 2 refills | Status: AC
  Filled 2022-09-03: qty 30, 30d supply, fill #0
  Filled 2022-10-08: qty 30, 30d supply, fill #1
  Filled 2023-01-22: qty 30, 30d supply, fill #2

## 2022-09-29 ENCOUNTER — Other Ambulatory Visit (HOSPITAL_BASED_OUTPATIENT_CLINIC_OR_DEPARTMENT_OTHER): Payer: Self-pay

## 2022-10-08 ENCOUNTER — Other Ambulatory Visit (HOSPITAL_BASED_OUTPATIENT_CLINIC_OR_DEPARTMENT_OTHER): Payer: Self-pay

## 2022-10-21 DIAGNOSIS — Z6841 Body Mass Index (BMI) 40.0 and over, adult: Secondary | ICD-10-CM | POA: Diagnosis not present

## 2022-10-21 DIAGNOSIS — R6882 Decreased libido: Secondary | ICD-10-CM | POA: Diagnosis not present

## 2022-10-21 DIAGNOSIS — Z01419 Encounter for gynecological examination (general) (routine) without abnormal findings: Secondary | ICD-10-CM | POA: Diagnosis not present

## 2022-10-26 ENCOUNTER — Other Ambulatory Visit (HOSPITAL_BASED_OUTPATIENT_CLINIC_OR_DEPARTMENT_OTHER): Payer: Self-pay

## 2022-10-27 ENCOUNTER — Other Ambulatory Visit: Payer: Self-pay | Admitting: Obstetrics and Gynecology

## 2022-10-27 DIAGNOSIS — R928 Other abnormal and inconclusive findings on diagnostic imaging of breast: Secondary | ICD-10-CM

## 2022-11-03 ENCOUNTER — Ambulatory Visit
Admission: RE | Admit: 2022-11-03 | Discharge: 2022-11-03 | Disposition: A | Discharge status: Home / Self Care | Payer: BC Managed Care – PPO | Source: Ambulatory Visit | Attending: Obstetrics and Gynecology | Admitting: Obstetrics and Gynecology

## 2022-11-03 DIAGNOSIS — R928 Other abnormal and inconclusive findings on diagnostic imaging of breast: Secondary | ICD-10-CM

## 2022-11-05 ENCOUNTER — Other Ambulatory Visit (HOSPITAL_BASED_OUTPATIENT_CLINIC_OR_DEPARTMENT_OTHER): Payer: Self-pay

## 2022-11-05 ENCOUNTER — Other Ambulatory Visit: Payer: Self-pay

## 2022-11-05 DIAGNOSIS — E559 Vitamin D deficiency, unspecified: Secondary | ICD-10-CM | POA: Diagnosis not present

## 2022-11-05 DIAGNOSIS — E039 Hypothyroidism, unspecified: Secondary | ICD-10-CM | POA: Diagnosis not present

## 2022-11-05 DIAGNOSIS — K581 Irritable bowel syndrome with constipation: Secondary | ICD-10-CM | POA: Diagnosis not present

## 2022-11-05 MED ORDER — LEVOTHYROXINE SODIUM 25 MCG PO TABS
25.0000 ug | ORAL_TABLET | Freq: Every morning | ORAL | 1 refills | Status: AC
  Filled 2022-11-05: qty 30, 30d supply, fill #0
  Filled 2022-12-14: qty 30, 30d supply, fill #1
  Filled 2023-01-22: qty 30, 30d supply, fill #2

## 2022-11-05 MED ORDER — OMEPRAZOLE 40 MG PO CPDR
40.0000 mg | DELAYED_RELEASE_CAPSULE | Freq: Every day | ORAL | 1 refills | Status: AC
Start: 2022-11-05 — End: ?
  Filled 2022-11-05: qty 30, 30d supply, fill #0
  Filled 2023-01-25: qty 30, 30d supply, fill #1

## 2022-11-05 MED ORDER — ZEPBOUND 15 MG/0.5ML ~~LOC~~ SOAJ
15.0000 mg | SUBCUTANEOUS | 1 refills | Status: AC
  Filled 2022-11-05: qty 2, 28d supply, fill #0

## 2022-11-09 ENCOUNTER — Encounter (HOSPITAL_BASED_OUTPATIENT_CLINIC_OR_DEPARTMENT_OTHER): Payer: Self-pay

## 2022-11-09 ENCOUNTER — Other Ambulatory Visit: Payer: Self-pay

## 2022-11-13 ENCOUNTER — Other Ambulatory Visit: Payer: Self-pay | Admitting: Obstetrics and Gynecology

## 2022-11-13 ENCOUNTER — Other Ambulatory Visit (HOSPITAL_BASED_OUTPATIENT_CLINIC_OR_DEPARTMENT_OTHER): Payer: Self-pay

## 2022-11-13 DIAGNOSIS — Z803 Family history of malignant neoplasm of breast: Secondary | ICD-10-CM

## 2022-11-18 ENCOUNTER — Other Ambulatory Visit (HOSPITAL_BASED_OUTPATIENT_CLINIC_OR_DEPARTMENT_OTHER): Payer: Self-pay

## 2022-12-15 DIAGNOSIS — K581 Irritable bowel syndrome with constipation: Secondary | ICD-10-CM | POA: Diagnosis not present

## 2022-12-15 DIAGNOSIS — E039 Hypothyroidism, unspecified: Secondary | ICD-10-CM | POA: Diagnosis not present

## 2022-12-15 DIAGNOSIS — E559 Vitamin D deficiency, unspecified: Secondary | ICD-10-CM | POA: Diagnosis not present

## 2023-01-25 ENCOUNTER — Other Ambulatory Visit (HOSPITAL_BASED_OUTPATIENT_CLINIC_OR_DEPARTMENT_OTHER): Payer: Self-pay

## 2023-01-30 ENCOUNTER — Other Ambulatory Visit (HOSPITAL_BASED_OUTPATIENT_CLINIC_OR_DEPARTMENT_OTHER): Payer: Self-pay

## 2023-02-04 DIAGNOSIS — E66812 Obesity, class 2: Secondary | ICD-10-CM | POA: Diagnosis not present

## 2023-02-04 DIAGNOSIS — K581 Irritable bowel syndrome with constipation: Secondary | ICD-10-CM | POA: Diagnosis not present

## 2023-02-04 DIAGNOSIS — E039 Hypothyroidism, unspecified: Secondary | ICD-10-CM | POA: Diagnosis not present

## 2023-02-04 DIAGNOSIS — E559 Vitamin D deficiency, unspecified: Secondary | ICD-10-CM | POA: Diagnosis not present

## 2023-03-03 DIAGNOSIS — R0981 Nasal congestion: Secondary | ICD-10-CM | POA: Diagnosis not present

## 2023-03-03 DIAGNOSIS — Z03818 Encounter for observation for suspected exposure to other biological agents ruled out: Secondary | ICD-10-CM | POA: Diagnosis not present

## 2023-03-03 DIAGNOSIS — R0989 Other specified symptoms and signs involving the circulatory and respiratory systems: Secondary | ICD-10-CM | POA: Diagnosis not present

## 2023-03-03 DIAGNOSIS — R053 Chronic cough: Secondary | ICD-10-CM | POA: Diagnosis not present

## 2023-04-13 DIAGNOSIS — E559 Vitamin D deficiency, unspecified: Secondary | ICD-10-CM | POA: Diagnosis not present

## 2023-04-13 DIAGNOSIS — E66812 Obesity, class 2: Secondary | ICD-10-CM | POA: Diagnosis not present

## 2023-04-13 DIAGNOSIS — E039 Hypothyroidism, unspecified: Secondary | ICD-10-CM | POA: Diagnosis not present

## 2023-04-13 DIAGNOSIS — K581 Irritable bowel syndrome with constipation: Secondary | ICD-10-CM | POA: Diagnosis not present

## 2023-05-21 ENCOUNTER — Encounter: Payer: Self-pay | Admitting: Obstetrics and Gynecology

## 2023-05-24 ENCOUNTER — Inpatient Hospital Stay: Admission: RE | Admit: 2023-05-24 | Payer: BC Managed Care – PPO | Source: Ambulatory Visit

## 2023-06-08 DIAGNOSIS — E559 Vitamin D deficiency, unspecified: Secondary | ICD-10-CM | POA: Diagnosis not present

## 2023-06-08 DIAGNOSIS — K581 Irritable bowel syndrome with constipation: Secondary | ICD-10-CM | POA: Diagnosis not present

## 2023-06-08 DIAGNOSIS — E039 Hypothyroidism, unspecified: Secondary | ICD-10-CM | POA: Diagnosis not present

## 2023-06-08 DIAGNOSIS — E66812 Obesity, class 2: Secondary | ICD-10-CM | POA: Diagnosis not present

## 2023-08-09 DIAGNOSIS — E039 Hypothyroidism, unspecified: Secondary | ICD-10-CM | POA: Diagnosis not present

## 2023-08-09 DIAGNOSIS — E559 Vitamin D deficiency, unspecified: Secondary | ICD-10-CM | POA: Diagnosis not present

## 2023-08-09 DIAGNOSIS — E66812 Obesity, class 2: Secondary | ICD-10-CM | POA: Diagnosis not present

## 2023-08-09 DIAGNOSIS — K581 Irritable bowel syndrome with constipation: Secondary | ICD-10-CM | POA: Diagnosis not present

## 2023-10-04 DIAGNOSIS — E559 Vitamin D deficiency, unspecified: Secondary | ICD-10-CM | POA: Diagnosis not present

## 2023-10-04 DIAGNOSIS — Z13220 Encounter for screening for lipoid disorders: Secondary | ICD-10-CM | POA: Diagnosis not present

## 2023-10-04 DIAGNOSIS — E039 Hypothyroidism, unspecified: Secondary | ICD-10-CM | POA: Diagnosis not present

## 2023-10-04 DIAGNOSIS — E66811 Obesity, class 1: Secondary | ICD-10-CM | POA: Diagnosis not present

## 2023-10-04 DIAGNOSIS — K581 Irritable bowel syndrome with constipation: Secondary | ICD-10-CM | POA: Diagnosis not present

## 2023-10-06 DIAGNOSIS — D224 Melanocytic nevi of scalp and neck: Secondary | ICD-10-CM | POA: Diagnosis not present

## 2023-10-06 DIAGNOSIS — L720 Epidermal cyst: Secondary | ICD-10-CM | POA: Diagnosis not present

## 2023-10-06 DIAGNOSIS — D485 Neoplasm of uncertain behavior of skin: Secondary | ICD-10-CM | POA: Diagnosis not present

## 2023-10-06 DIAGNOSIS — L738 Other specified follicular disorders: Secondary | ICD-10-CM | POA: Diagnosis not present

## 2023-12-20 DIAGNOSIS — E559 Vitamin D deficiency, unspecified: Secondary | ICD-10-CM | POA: Diagnosis not present

## 2023-12-20 DIAGNOSIS — E039 Hypothyroidism, unspecified: Secondary | ICD-10-CM | POA: Diagnosis not present

## 2023-12-20 DIAGNOSIS — K581 Irritable bowel syndrome with constipation: Secondary | ICD-10-CM | POA: Diagnosis not present
